# Patient Record
Sex: Female | Born: 1970 | Race: White | Hispanic: No | Marital: Single
Health system: Midwestern US, Community
[De-identification: ages and names within clinical notes are randomized; demographics above are authoritative.]

---

## 2014-09-03 ENCOUNTER — Encounter (HOSPITAL_COMMUNITY): Payer: Self-pay | Admitting: Emergency Medicine

## 2014-09-03 ENCOUNTER — Emergency Department (HOSPITAL_COMMUNITY): Payer: Medicaid - Out of State

## 2014-09-03 ENCOUNTER — Emergency Department (HOSPITAL_COMMUNITY)
Admission: EM | Admit: 2014-09-03 | Discharge: 2014-09-03 | Disposition: A | Discharge status: Home / Self Care | Payer: Medicaid - Out of State | Attending: Emergency Medicine | Admitting: Emergency Medicine

## 2014-09-03 ENCOUNTER — Emergency Department (HOSPITAL_COMMUNITY)
Admission: EM | Admit: 2014-09-03 | Discharge: 2014-09-03 | Disposition: A | Discharge status: Home / Self Care | Payer: PRIVATE HEALTH INSURANCE | Attending: Emergency Medicine | Admitting: Emergency Medicine

## 2014-09-03 DIAGNOSIS — F209 Schizophrenia, unspecified: Secondary | ICD-10-CM | POA: Diagnosis not present

## 2014-09-03 DIAGNOSIS — R112 Nausea with vomiting, unspecified: Secondary | ICD-10-CM | POA: Diagnosis not present

## 2014-09-03 DIAGNOSIS — Z720 Tobacco use: Secondary | ICD-10-CM | POA: Insufficient documentation

## 2014-09-03 DIAGNOSIS — Z046 Encounter for general psychiatric examination, requested by authority: Secondary | ICD-10-CM | POA: Insufficient documentation

## 2014-09-03 DIAGNOSIS — Z3202 Encounter for pregnancy test, result negative: Secondary | ICD-10-CM | POA: Diagnosis not present

## 2014-09-03 DIAGNOSIS — N730 Acute parametritis and pelvic cellulitis: Secondary | ICD-10-CM | POA: Insufficient documentation

## 2014-09-03 DIAGNOSIS — R1030 Lower abdominal pain, unspecified: Secondary | ICD-10-CM | POA: Diagnosis present

## 2014-09-03 DIAGNOSIS — Z008 Encounter for other general examination: Secondary | ICD-10-CM

## 2014-09-03 DIAGNOSIS — R102 Pelvic and perineal pain: Secondary | ICD-10-CM

## 2014-09-03 LAB — COMPREHENSIVE METABOLIC PANEL
ALBUMIN: 3.8 g/dL (ref 3.5–5.2)
ALT: 12 U/L (ref 0–35)
AST: 12 U/L (ref 0–37)
Alkaline Phosphatase: 88 U/L (ref 39–117)
Anion gap: 19 — ABNORMAL HIGH (ref 5–15)
BUN: 8 mg/dL (ref 6–23)
CALCIUM: 8.6 mg/dL (ref 8.4–10.5)
CO2: 24 mEq/L (ref 19–32)
CREATININE: 0.83 mg/dL (ref 0.50–1.10)
Chloride: 101 mEq/L (ref 96–112)
GFR calc Af Amer: >90 mL/min (ref >90)
GFR calc non Af Amer: 86 mL/min — ABNORMAL LOW (ref >90)
Glucose, Bld: 86 mg/dL (ref 70–99)
Potassium: 4.5 mEq/L (ref 3.7–5.3)
Sodium: 144 mEq/L (ref 137–147)
Total Bilirubin: 0.3 mg/dL (ref 0.3–1.2)
Total Protein: 8.7 g/dL — ABNORMAL HIGH (ref 6.0–8.3)

## 2014-09-03 LAB — CBC WITH DIFFERENTIAL/PLATELET
BASOS ABS: 0 10*3/uL (ref 0.0–0.1)
Basophils Relative: 0 % (ref 0–1)
Eosinophils Absolute: 0.2 10*3/uL (ref 0.0–0.7)
Eosinophils Relative: 1 % (ref 0–5)
HCT: 45.1 % (ref 36.0–46.0)
Hemoglobin: 14.8 g/dL (ref 12.0–15.0)
LYMPHS ABS: 3.2 10*3/uL (ref 0.7–4.0)
Lymphocytes Relative: 21 % (ref 12–46)
MCH: 28.8 pg (ref 26.0–34.0)
MCHC: 32.8 g/dL (ref 30.0–36.0)
MCV: 87.7 fL (ref 78.0–100.0)
MONO ABS: 0.9 10*3/uL (ref 0.1–1.0)
MONOS PCT: 6 % (ref 3–12)
Neutro Abs: 10.9 10*3/uL — ABNORMAL HIGH (ref 1.7–7.7)
Neutrophils Relative %: 72 % (ref 43–77)
Platelets: 210 10*3/uL (ref 150–400)
RBC: 5.14 MIL/uL — ABNORMAL HIGH (ref 3.87–5.11)
RDW: 15.3 % (ref 11.5–15.5)
WBC: 15.2 10*3/uL — ABNORMAL HIGH (ref 4.0–10.5)

## 2014-09-03 LAB — URINALYSIS, ROUTINE W REFLEX MICROSCOPIC
BILIRUBIN URINE: NEGATIVE
GLUCOSE, UA: NEGATIVE mg/dL
HGB URINE DIPSTICK: NEGATIVE
Ketones, ur: NEGATIVE mg/dL
NITRITE: NEGATIVE
Protein, ur: NEGATIVE mg/dL
SPECIFIC GRAVITY, URINE: 1.027 (ref 1.005–1.030)
Urobilinogen, UA: 0.2 mg/dL (ref 0.0–1.0)
pH: 5.5 (ref 5.0–8.0)

## 2014-09-03 LAB — I-STAT BETA HCG BLOOD, ED (MC, WL, AP ONLY): I-stat hCG, quantitative: <5 m[IU]/mL (ref <5)

## 2014-09-03 LAB — RPR

## 2014-09-03 LAB — URINE MICROSCOPIC-ADD ON

## 2014-09-03 LAB — WET PREP, GENITAL
Clue Cells Wet Prep HPF POC: NONE SEEN
Trich, Wet Prep: NONE SEEN
Yeast Wet Prep HPF POC: NONE SEEN

## 2014-09-03 LAB — LIPASE, BLOOD: LIPASE: 20 U/L (ref 11–59)

## 2014-09-03 LAB — HIV ANTIBODY (ROUTINE TESTING W REFLEX): HIV 1&2 Ab, 4th Generation: NONREACTIVE

## 2014-09-03 MED ORDER — ONDANSETRON 8 MG PO TBDP
8.0000 mg | ORAL_TABLET | Freq: Once | ORAL | Status: AC
  Administered 2014-09-03: 8 mg via ORAL
  Filled 2014-09-03: qty 1

## 2014-09-03 MED ORDER — DOXYCYCLINE HYCLATE 100 MG PO CAPS: 100.0000 mg | ORAL_CAPSULE | Freq: Two times a day (BID) | ORAL | Status: AC

## 2014-09-03 MED ORDER — IOHEXOL 300 MG/ML  SOLN
100.0000 mL | Freq: Once | INTRAMUSCULAR | Status: AC | PRN
  Administered 2014-09-03: 100 mL via INTRAVENOUS

## 2014-09-03 MED ORDER — CEFTRIAXONE SODIUM 250 MG IJ SOLR
250.0000 mg | Freq: Once | INTRAMUSCULAR | Status: AC
  Administered 2014-09-03: 250 mg via INTRAMUSCULAR
  Filled 2014-09-03: qty 250

## 2014-09-03 MED ORDER — LIDOCAINE HCL 1 % IJ SOLN
INTRAMUSCULAR | Status: AC
  Administered 2014-09-03: 20 mL
  Filled 2014-09-03: qty 20

## 2014-09-03 MED ORDER — METRONIDAZOLE 500 MG PO TABS: 500.0000 mg | ORAL_TABLET | Freq: Two times a day (BID) | ORAL | Status: AC

## 2014-09-03 NOTE — ED Notes (Signed)
Pt c/o acute onset lower abd pain, worse on Left since 2000 last night. N/v per pt for last few days.
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
## 2014-09-03 NOTE — ED Notes (Signed)
Pt given a food tray.

## 2014-09-03 NOTE — ED Notes (Signed)
Per SW, Brandi Lawrence, [facility] [employer] around 6pm. Was also given a cab voucher in case PD gets busy and unable to transport. Pt aware of plan.

## 2014-09-03 NOTE — Discharge Instructions (Signed)
°Emergency Department Resource Guide °1) Find a Doctor and Pay Out of Pocket °Although you won't have to find out who is covered by your insurance plan, it is a good idea to ask around and get recommendations. You will then need to call the office and see if the doctor you have chosen will accept you as a new patient and what types of options they offer for patients who are self-pay. Some doctors offer discounts or will set up payment plans for their patients who do not have insurance, but you will need to ask so you aren't surprised when you get to your appointment. ° °2) Contact Your Local Health Department °Not all health departments have doctors that can see patients for sick visits, but many do, so it is worth a call to see if yours does. If you don't know where your local health department is, you can check in your phone book. The CDC also has a tool to help you locate your state's health department, and many state websites also have listings of all of their local health departments. ° °3) Find a Walk-in Clinic °If your illness is not likely to be very severe or complicated, you may want to try a walk in clinic. These are popping up all over the country in pharmacies, drugstores, and shopping centers. They're usually staffed by nurse practitioners or physician assistants that have been trained to treat common illnesses and complaints. They're usually fairly quick and inexpensive. However, if you have serious medical issues or chronic medical problems, these are probably not your best option. ° °No Primary Care Doctor: °- Call [facility] at  832-8000 - they can help you locate a primary care doctor that  accepts your insurance, provides certain services, etc. °- Physician Referral Service- 1-800-533-3463 ° °Chronic Pain Problems: °Organization         Address  Phone   Notes  °[facility]  (336) 297-2271 Patients need to be referred by their primary care doctor.  ° °Medication  Assistance: °Organization         Address  Phone   Notes  °[place] [facility] [address] °[place], [place] [address] (336) 641-8030 --Must be a resident of [place] °-- Must have NO insurance coverage whatsoever (no Medicaid/ Medicare, etc.) °-- The pt. MUST have a primary care doctor that directs their care regularly and follows them in the community °  °[facility]  (866) 331-1348   °[employer]  (888) 892-1162   ° °Agencies that provide inexpensive medical care: °Organization         Address  Phone   Notes  °[facility]  (336) 832-8035   °[facility]    (336) 832-7272   °[facility] [address] °[place], [place] [address] (336) 832-4777   °[facility] [address], °[place] (336) 271-4999   °[facility]    (336) 373-0678   °[facility]    (336) 272-1050   °[facility] ° [address], [place] Phone:  (336) 832-4444, Fax:  (336) 832-4440 Hours of Operation:  9 am - 6 pm, M-F.  Also accepts Medicaid/Medicare and self-pay.  °[facility] ° [address],  Phone: (336) 832-3150, Fax: (336) 832-3151. Hours of Operation:  8:30 am - 5:30 pm, M-F.  Also accepts Medicaid and self-pay.  °[facility] [place] [address], [place] Phone: (336) 878-6027   °[facility] [address], [place], [place] (336)723-1848, Ext. 123 Mondays & Thursdays: 7-9 AM.  First 15 patients are seen on a first come, first serve basis. °  ° °Medicaid-accepting [place] Providers: ° °Organization         Address  Phone   Notes  °[facility] [address], [place] (336) 641-2100 Also accepts self-pay patients.  °[facility] [address], [place] ° (336) 856-9996   °[facility] [address], [place]  (336) 288-8857   °[facility] [address] [place] [address], [place] (336) 299-7000   °Veita Bland [address], [place]  ° (336) 373-1557 Only accepts [place] Access Medicaid patients after they have their name applied to their card.  ° °Self-Pay (no insurance) in [place]: ° °Organization         Address  Phone   Notes  °Sickle Cell Patients, [facility] [address], [place] (336) 832-1970   °[facility] [address], [place] (336) 832-4400   °[facility] ° [address], [place] (336) 992-4800   °[facility]/Dr. Osei-Bonsu ° [address] [place] [address], [place] or [address], [place] (336) 841-8500 Phone number for both [place] and [place] locations is the same.  °[facility] [address], [place] (336) 299-0000   °[facility] [address] [place] [address], [place] or [address] (336) 852-7530 °(336) 878-2260   °[facility] [address], [place] (336) 350-1642, phone; (336) 294-5005, fax Sees patients 1st and 3rd Saturday of every month.  Must not qualify for public or private insurance (i.e. Medicaid, Medicare, [place] Health Choice, Veterans' Benefits) • Household income should be no more than 200% of the poverty level •The clinic cannot treat you if you are pregnant or think you are pregnant • Sexually transmitted diseases are not treated at the clinic.  ° ° °Dental Care: °Organization         Address  Phone  Notes  °[place] [facility] [address], [place] (336) 641-6152 Accepts children up to age 21 who are enrolled in Medicaid or [place] Health Choice; pregnant women with a Medicaid card; and children who have applied for Medicaid or [place] Health Choice, but were declined, whose parents can pay a reduced fee at time of service.  °[place]  [facility] [place]  [address], [place] (336) 641-7733 Accepts children up to age 21 who are enrolled in Medicaid or [place] Health Choice; pregnant women with a Medicaid card; and children who have applied for Medicaid or [place] Health Choice, but were declined, whose parents can pay a reduced fee at time of service.  °[facility] ° [address], [place] (336) 641-4533 Patients are seen by appointment only. Walk-ins are not accepted. [facility] will see patients 18 years of age and older. °Monday - Tuesday (8am-5pm) °Most Wednesdays (8:30-5pm) °$30 per visit, cash only  °[facility] ° [address], [place] (336) 641-4533 Patients are seen by appointment only. Walk-ins are not accepted. [facility] will see patients 18 years of age and older. °One   Wednesday Evening (Monthly: Volunteer Based).  $30 per visit, cash only  °[facility]  (919) 537-3737 for adults; Children under age 4, call Graduate Pediatric Dentistry at (919) [phone]. Children aged 4-14, please call (919) [phone] to request a pediatric application. ° Dental services are provided in all areas of dental care including fillings, crowns and bridges, complete and partial dentures, implants, gum treatment, root canals, and extractions. Preventive care is also provided. Treatment is provided to both adults and children. °Patients are selected via a lottery and there is often a waiting list. °  °[facility] [address], °[place] ° (336) 763-8833 [email or web address] °  °[facility] [address], [place], [place] (336)723-1848, Ext. 123 Second and Fourth Thursday of each month, opens at 6:30 AM; Clinic ends at 9 AM.  Patients are seen on a first-come first-served basis, and a limited number are seen during each clinic.  ° °[facility] ° [address], [place], [place] (336) 723-7904    Eligibility Requirements °You must have lived in [place], or [place] counties for at least the last three months. °  You cannot be eligible for state or federal sponsored healthcare insurance, including Veterans Administration, Medicaid, or Medicare. °  You generally cannot be eligible for healthcare insurance through your employer.  °  How to apply: °Eligibility screenings are held every Tuesday and Wednesday afternoon from 1:00 pm until 4:00 pm. You do not need an appointment for the interview!  °[facility] [address], [place], [place] 336-631-2330   °[facility] [facility]  336-342-8273   °[facility]  336-703-3100   ° [facility]  336-570-6415   ° °Behavioral Health Resources in the Community: °Intensive Outpatient Programs °Organization         Address  Phone  Notes  °[facility] [address], [place], [place] 336-878-6098   °[facility] [address], [place], [place] 336-832-9800   °[facility] [address], [place], [place] ° 336-882-2125   °[facility] [address],  °[place], [place] 1-800-853-5163 or 336-641-4981   °Substance Abuse Resources °Organization         Address  Phone  Notes  °[facility]  336-882-2125   °[facility]  336-784-9470   °[facility]  336-285-9073   °[facility]  336-845-3988   °Residential & Outpatient Substance Abuse Program  1-800-659-3381   °Psychological Services °Organization         Address  Phone  Notes  °[facility]  336- 832-9600   °[facility]  336- 378-7881   °[facility] [address], [place] 1-800-853-5163 or 336-641-4981   ° °Mobile Crisis Teams °Organization         Address  Phone  Notes  °[facility], Mobile Crisis Care Unit  1-877-626-1772   °Assertive °[facility] ° [address]  [place], [place] 336-834-9664   °Sharon DeEsch [address] °[place] 336-554-5454   ° °Self-Help/Support Groups °Organization         Address  Phone             Notes  °[facility] [place] - variety of support groups  336- 373-1402 Call for more information  °Narcotics Anonymous (NA), [facility] [address], °[place]  2 meetings at this location  ° °  Residential Treatment Programs °Organization         Address  Phone  Notes  °[facility] [address],    °[place]  1-866-801-8205   °[facility] ° [address], [place], [place] 704-293-8524   °[facility] [address], [place] 336-845-3988 Admissions: 8am-3pm M-F  °[facility] [address]    °[place], [place] 336-841-1104   °[facility] [address], [place], [place] 336-379-7146   °[facility] [address]  °[place], [place] 336-285-9073   °[facility] Intensive Outpatient [address], [place], [place] 336-852-3033   °[facility] [address]  °[place], [place] 1-877-615-2722 or 336-784-9470   °[facility] [address] [place], [place] 336-227-7417 Accepts Medicaid  °[facility] [address]  ° [place] 1-800-659-3381 Substance Abuse/Addiction Treatment  ° °[place] Behavioral Health Resources °Organization         Address  Phone  Notes  °[facility]  (888) 581-9988   °Julie Brannon, PhD [address] [place], [place]   (336) 349-5553 or (336) 951-0000   °[facility]   [address] °[place], [place] (336) 349-4454   °[facility] [address], [place], [place] (336) 342-8316 Insurance/Medicaid/sponsorship through [facility]  °[facility] [address]                                    [place], [place] (336) 342-8316 Therapy/tele-psych/case    °[facility] [address]  ° [place], [place] (336) 349-2233    °Dr. Arfeen  (336) 349-4544   °[facility] [place]  [employer] [place] [facility] 1) [address], [place] °2) [address], [place] °3)  [address], [place] (336) 349-3220 °(336) 342-7768 ° °(336) 342-8140   °[place] [facility] (336) 342-1394 or (336) 342-3537 (After Hours)    ° ° °

## 2014-09-03 NOTE — ED Provider Notes (Signed)
CSN: [id number]     Arrival date & time 09/03/14  1134 History   First MD Initiated Contact with Patient 09/03/14 1140     Chief Complaint  Patient presents with  . Abdominal Pain  . Nausea     (Consider location/radiation/quality/duration/timing/severity/associated sxs/prior Treatment) HPI Comments: Patient here complaining of worsening lower abdominal pain 1 month. Patient is also a schizophrenic and she has been noncompliant with medications for month. She denies any suicidal or homicidal ideations. She denies any command hallucinations. Abdominal pain is persistent and characterized as dull and located at her left upper and left lower quadrant. Denies any vaginal bleeding or discharge. Denies any urinary symptoms. Symptoms persistent and no association with food. No treatment use prior to arrival. She has had some nausea and vomiting which has been nonbilious. Denies any black or bloody stools. No prior history of same  Patient is a 43 y.o. female presenting with abdominal pain. The history is provided by the patient.  Abdominal Pain   History reviewed. No pertinent past medical history. No past surgical history on file. No family history on file. History  Substance Use Topics  . Smoking status: Not on file  . Smokeless tobacco: Not on file  . Alcohol Use: Not on file   OB History   Grav Para Term Preterm Abortions TAB SAB Ect Mult Living                 Review of Systems  Gastrointestinal: Positive for abdominal pain.  All other systems reviewed and are negative.     Allergies  Review of patient's allergies indicates not on file.  Home Medications   Prior to Admission medications   Not on File   BP 131/90  Pulse 101  Temp(Src) 98.5 F (36.9 C) (Oral)  Resp 20  SpO2 95% Physical Exam  Nursing note and vitals reviewed. Constitutional: She is oriented to person, place, and time. She appears well-developed and well-nourished.  Non-toxic appearance. No  distress.  HENT:  Head: Normocephalic and atraumatic.  Eyes: Conjunctivae, EOM and lids are normal. Pupils are equal, round, and reactive to light.  Neck: Normal range of motion. Neck supple. No tracheal deviation present. No mass present.  Cardiovascular: Normal rate, regular rhythm and normal heart sounds.  Exam reveals no gallop.   No murmur heard. Pulmonary/Chest: Effort normal and breath sounds normal. No stridor. No respiratory distress. She has no decreased breath sounds. She has no wheezes. She has no rhonchi. She has no rales.  Abdominal: Soft. Normal appearance and bowel sounds are normal. She exhibits no distension. There is tenderness in the epigastric area and left upper quadrant. There is no rigidity, no rebound, no guarding and no CVA tenderness.  Musculoskeletal: Normal range of motion. She exhibits no edema and no tenderness.  Neurological: She is alert and oriented to person, place, and time. She has normal strength. No cranial nerve deficit or sensory deficit. GCS eye subscore is 4. GCS verbal subscore is 5. GCS motor subscore is 6.  Skin: Skin is warm and dry. No abrasion and no rash noted.  Psychiatric: Her speech is normal and behavior is normal. Her affect is blunt.    ED Course  Procedures (including critical care time) Labs Review Labs Reviewed  URINE CULTURE  CBC WITH DIFFERENTIAL  COMPREHENSIVE METABOLIC PANEL  URINALYSIS, ROUTINE W REFLEX MICROSCOPIC  LIPASE, BLOOD    Imaging Review No results found.   EKG Interpretation None      MDM  Final diagnoses:  None    Patient to be treated for PID with Rocephin and Flagyl and doxycycline.    Toy BakerAnthony T Dennette Faulconer, MD 09/03/14 [phone]

## 2014-09-03 NOTE — ED Notes (Signed)
Pt provided with update about trying to get case worker or SW to help her. Pt also provided with sandwich and coke. Pt denies pain, is A&O and in NAD
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
## 2014-09-03 NOTE — ED Notes (Signed)
Bed: WA07 Expected date: 09/03/14 Expected time: 11:27 AM Means of arrival: Ambulance Comments: abd pain
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
## 2014-09-03 NOTE — Progress Notes (Addendum)
4:37pm. Thayer JewJanice Mays at [facility] has offered pt a [facility]. Pt can go after 6pm.  [facility]: [address], [place] Ph #: [phone]   Mariann LasterAlexandra Matson Welch LCSWA,     ED CSW  phone: [phone]
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
## 2014-09-03 NOTE — ED Notes (Signed)
Brandi Lawrence SW was told about by this RN and [employer] officer. SW will come talk to her soon

## 2014-09-03 NOTE — ED Notes (Signed)
[employer] off duty Technical sales engineerofficer and RN Hailey at bedside talking to pt

## 2014-09-03 NOTE — Discharge Instructions (Signed)
Pelvic Inflammatory Disease °Pelvic inflammatory disease (PID) refers to an infection in some or all of the female organs. The infection can be in the uterus, ovaries, fallopian tubes, or the surrounding tissues in the pelvis. PID can cause abdominal or pelvic pain that comes on suddenly (acute pelvic pain). PID is a serious infection because it can lead to lasting (chronic) pelvic pain or the inability to have children (infertile).  °CAUSES  °The infection is often caused by the normal bacteria found in the vaginal tissues. PID may also be caused by an infection that is spread during sexual contact. PID can also occur following:  °· The birth of a baby.   °· A miscarriage.   °· An abortion.   °· Major pelvic surgery.   °· The use of an intrauterine device (IUD).   °· A sexual assault.   °RISK FACTORS °Certain factors can put a person at higher risk for PID, such as: °· Being younger than 25 years. °· Being sexually active at a young age. °· Using nonbarrier contraception. °· Having multiple sexual partners. °· Having sex with someone who has symptoms of a genital infection. °· Using oral contraception. °Other times, certain behaviors can increase the possibility of getting PID, such as: °· Having sex during your period. °· Using a vaginal douche. °· Having an intrauterine device (IUD) in place. °SYMPTOMS  °· Abdominal or pelvic pain.   °· Fever.   °· Chills.   °· Abnormal vaginal discharge. °· Abnormal uterine bleeding.   °· Unusual pain shortly after finishing your period. °DIAGNOSIS  °Your caregiver will choose some of the following methods to make a diagnosis, such as:  °· Performing a physical exam and history. A pelvic exam typically reveals a very tender uterus and surrounding pelvis.   °· Ordering laboratory tests including a pregnancy test, blood tests, and urine test.  °· Ordering cultures of the vagina and cervix to check for a sexually transmitted infection (STI). °· Performing an ultrasound.    °· Performing a laparoscopic procedure to look inside the pelvis.   °TREATMENT  °· Antibiotic medicines may be prescribed and taken by mouth.   °· Sexual partners may be treated when the infection is caused by a sexually transmitted disease (STD).   °· Hospitalization may be needed to give antibiotics intravenously. °· Surgery may be needed, but this is rare. °It may take weeks until you are completely well. If you are diagnosed with PID, you should also be checked for human immunodeficiency virus (HIV).   °HOME CARE INSTRUCTIONS  °· If given, take your antibiotics as directed. Finish the medicine even if you start to feel better.   °· Only take over-the-counter or prescription medicines for pain, discomfort, or fever as directed by your caregiver.   °· Do not have sexual intercourse until treatment is completed or as directed by your caregiver. If PID is confirmed, your recent sexual partner(s) will need treatment.   °· Keep your follow-up appointments. °SEEK MEDICAL CARE IF:  °· You have increased or abnormal vaginal discharge.   °· You need prescription medicine for your pain.   °· You vomit.   °· You cannot take your medicines.   °· Your partner has an STD.   °SEEK IMMEDIATE MEDICAL CARE IF:  °· You have a fever.   °· You have increased abdominal or pelvic pain.   °· You have chills.   °· You have pain when you urinate.   °· You are not better after 72 hours following treatment.   °MAKE SURE YOU:  °· Understand these instructions. °· Will watch your condition. °· Will get help right away if you are not doing well or get worse. °  Document Released: 10/21/2005 Document Revised: 02/15/2013 Document Reviewed: 10/17/2011 °ExitCare® Patient Information ©2015 [employer]. This information is not intended to replace advice given to you by your health care provider. Make sure you discuss any questions you have with your health care provider. ° °
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
## 2014-09-05 LAB — URINE CULTURE

## 2014-09-06 LAB — GC/CHLAMYDIA PROBE AMP
CT Probe RNA: NEGATIVE
GC Probe RNA: NEGATIVE

## 2014-11-10 DIAGNOSIS — J069 Acute upper respiratory infection, unspecified: Secondary | ICD-10-CM

## 2014-11-10 NOTE — ED Provider Notes (Signed)
HPI Comments: Patient currently homeless was [facility] last night and today at the shelter, started feeling "achy" with upper and lower abdominal pain.  Patient relates some nausea, when asked how many times she vomited she said more than I can count, when pressed for further details she estimates that to be 3 or 4 times today.    Patient is a 44 y.o. female presenting with other event. The history is provided by the patient.   Other  This is a new problem. The current episode started 2 days ago. The problem occurs constantly. The problem has not changed since onset.Associated symptoms include abdominal pain and headaches. Pertinent negatives include no chest pain and no shortness of breath. Associated symptoms comments: Nausea, abdominal pain.. Nothing relieves the symptoms. She has tried nothing for the symptoms. The treatment provided no relief.        History reviewed. No pertinent past medical history.    History reviewed. No pertinent past surgical history.      History reviewed. No pertinent family history.    History     Social History   ??? Marital Status: SINGLE     Spouse Name: N/A     Number of Children: N/A   ??? Years of Education: N/A     Occupational History   ??? Not on file.     Social History Main Topics   ??? Smoking status: Current Every Day Smoker -- 1.00 packs/day   ??? Smokeless tobacco: Not on file   ??? Alcohol Use: No   ??? Drug Use: No   ??? Sexual Activity: Not on file     Other Topics Concern   ??? Not on file     Social History Narrative   ??? No narrative on file                ALLERGIES: Review of patient's allergies indicates no known allergies.      Review of Systems   Constitutional: Negative for fever and chills.   HENT: Positive for congestion, rhinorrhea and sinus pressure. Negative for sore throat.    Eyes: Negative for discharge and redness.   Respiratory: Negative for cough and shortness of breath.    Cardiovascular: Negative for chest pain and palpitations.    Gastrointestinal: Positive for nausea, vomiting and abdominal pain. Negative for diarrhea.   Musculoskeletal: Negative for back pain and arthralgias.   Skin: Negative for rash.   Neurological: Positive for headaches. Negative for dizziness.   All other systems reviewed and are negative.      Filed Vitals:    11/10/14 2009   BP: 138/91   Pulse: 91   Temp: 97.5 ??F (36.4 ??C)   Resp: 20   Height: 5\' 3"  (1.6 m)   Weight: 99.791 kg (220 lb)   SpO2: 94%            Physical Exam   Constitutional: She is oriented to person, place, and time. She appears well-developed and well-nourished. No distress.   HENT:   Head: Normocephalic and atraumatic.   Mouth/Throat: No oropharyngeal exudate.   Eyes: Conjunctivae and EOM are normal. Pupils are equal, round, and reactive to light. Right eye exhibits no discharge. Left eye exhibits no discharge. No scleral icterus.   Neck: Normal range of motion. Neck supple.   Cardiovascular: Normal rate, regular rhythm and normal heart sounds.  Exam reveals no gallop.    No murmur heard.  Pulmonary/Chest: Effort normal and breath sounds normal. No respiratory distress.  She has no wheezes. She has no rales.   Abdominal: Soft. Bowel sounds are normal. There is no tenderness. There is no guarding.   Musculoskeletal: Normal range of motion. She exhibits no edema.   Neurological: She is alert and oriented to person, place, and time. She exhibits normal muscle tone.   cni 2-12 grossly   Skin: Skin is warm and dry. She is not diaphoretic.   Psychiatric: She has a normal mood and affect. Her behavior is normal.   Nursing note and vitals reviewed.       MDM    Procedures

## 2014-11-10 NOTE — ED Notes (Signed)
Unable to locate in room.  Belongings remain in room.

## 2014-11-10 NOTE — ED Notes (Signed)
States now homeless.  Trying to get to a friends in [place].  States she hurts because she has been walking around.  States needs a place to stay and very hungry.

## 2014-11-11 ENCOUNTER — Inpatient Hospital Stay
Admit: 2014-11-11 | Discharge: 2014-11-11 | Disposition: A | Discharge status: Home / Self Care | Payer: PRIVATE HEALTH INSURANCE | Attending: Emergency Medicine

## 2014-11-11 LAB — HCG URINE, QL. - POC: Pregnancy test,urine (POC): NEGATIVE

## 2014-11-11 MED ORDER — CEPHALEXIN 500 MG CAP
500 mg | ORAL_CAPSULE | Freq: Four times a day (QID) | ORAL | Status: AC
Start: 2014-11-11 — End: 2014-11-18

## 2014-11-11 MED ORDER — ACETAMINOPHEN 500 MG TAB
500 mg | ORAL | Status: AC
Start: 2014-11-11 — End: 2014-11-11
  Administered 2014-11-11: 05:00:00 via ORAL

## 2014-11-11 MED ORDER — CEPHALEXIN 500 MG CAP
500 mg | ORAL | Status: AC
Start: 2014-11-11 — End: 2014-11-11
  Administered 2014-11-11: 11:00:00 via ORAL

## 2014-11-11 MED ORDER — ONDANSETRON 8 MG TAB, RAPID DISSOLVE
8 mg | ORAL | Status: AC
Start: 2014-11-11 — End: 2014-11-11
  Administered 2014-11-11: 05:00:00 via ORAL

## 2014-11-11 MED ORDER — IBUPROFEN 800 MG TAB
800 mg | ORAL | Status: AC
Start: 2014-11-11 — End: 2014-11-11
  Administered 2014-11-11: 05:00:00 via ORAL

## 2014-11-11 MED FILL — CEPHALEXIN 500 MG CAP: 500 mg | ORAL | Qty: 1

## 2014-11-11 MED FILL — MAPAP EXTRA STRENGTH 500 MG TABLET: 500 mg | ORAL | Qty: 2

## 2014-11-11 MED FILL — IBUPROFEN 800 MG TAB: 800 mg | ORAL | Qty: 1

## 2014-11-11 MED FILL — ONDANSETRON 8 MG TAB, RAPID DISSOLVE: 8 mg | ORAL | Qty: 1
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
## 2014-11-11 NOTE — ED Notes (Signed)
Sleeping.  Respirations even and unlabored.

## 2014-11-11 NOTE — ED Notes (Signed)
Patient discharged to lobby.  States she was told by MD she could wait and talk to Child psychotherapistsocial worker.  Discharge instructions and prescription reviewed with understanding reviewed.

## 2014-11-12 DIAGNOSIS — M79606 Pain in leg, unspecified: Secondary | ICD-10-CM

## 2014-11-12 NOTE — ED Notes (Signed)
I have reviewed discharge instructions with the patient.  The patient verbalized understanding.  Dr. Ernestine ConradBerglind instructed that pt can stay out in lobby until 0600. [facility]

## 2014-11-12 NOTE — ED Notes (Signed)
Pt arrive via EMS from a shelter. Was seen 2 days ago here, diagnosed with UTI and has prescriptions but did not get them filled. Pt also c/o leg pain due to walking and having no home. EMS states they picked her up because there was no room at shelter and then that's when pt c/o leg pain from walking.

## 2014-11-12 NOTE — ED Provider Notes (Signed)
HPI Comments: 44 year old white female has been homeless who presented with a UTI recently. Was given prescriptions for antibiotics [facility] did not fill them.  She stated her lobby all last night.  She is unable to go back to the [facility] as she is else stayed her 3 days.  [facility] but they said because 6 AM in the morning they should have one opening at that time and would they would take her.    Patient is a 44 y.o. female presenting with urinary tract infection and leg pain.   Bladder Infection   Pertinent negatives include no frequency, no urgency and no flank pain.   Leg Pain   Pertinent negatives include no back pain and no neck pain.        History reviewed. No pertinent past medical history.    History reviewed. No pertinent past surgical history.      History reviewed. No pertinent family history.    History     Social History   ??? Marital Status: SINGLE     Spouse Name: N/A     Number of Children: N/A   ??? Years of Education: N/A     Occupational History   ??? Not on file.     Social History Main Topics   ??? Smoking status: Current Every Day Smoker -- 1.00 packs/day   ??? Smokeless tobacco: Not on file   ??? Alcohol Use: No   ??? Drug Use: No   ??? Sexual Activity: Not on file     Other Topics Concern   ??? Not on file     Social History Narrative                ALLERGIES: Review of patient's allergies indicates no known allergies.      Review of Systems   Constitutional: Negative for fever and fatigue.   HENT: Negative for congestion and sinus pressure.    Respiratory: Negative for cough, chest tightness and shortness of breath.    Cardiovascular: Negative for chest pain and leg swelling.   Gastrointestinal: Negative for abdominal pain.   Genitourinary: Negative for dysuria, urgency, frequency and flank pain.   Musculoskeletal: Negative for back pain, joint swelling, neck pain and neck stiffness.   Skin: Negative for rash.   Neurological: Negative for headaches.    Psychiatric/Behavioral: Negative for confusion and agitation.   All other systems reviewed and are negative.      Filed Vitals:    11/12/14 2006   BP: 133/78   Pulse: 102   Temp: 97.8 ??F (36.6 ??C)   Resp: 16   Height: 5\' 4"  (1.626 m)   Weight: 99.7 kg (219 lb 12.8 oz)   SpO2: 97%            Physical Exam   Constitutional: She is oriented to person, place, and time. She appears well-developed and well-nourished.   HENT:   Head: Normocephalic and atraumatic.   Right Ear: External ear normal.   Left Ear: External ear normal.   Nose: Nose normal.   Mouth/Throat: Oropharynx is clear and moist.   Eyes: Conjunctivae and EOM are normal. Pupils are equal, round, and reactive to light. Right eye exhibits no discharge. Left eye exhibits no discharge. No scleral icterus.   Neck: Normal range of motion. Neck supple. No thyromegaly present.   Cardiovascular: Normal rate, regular rhythm and intact distal pulses.  Exam reveals friction rub. Exam reveals no  gallop.    No murmur heard.  Pulmonary/Chest: Effort normal and breath sounds normal. No respiratory distress. She has no wheezes.   Abdominal: Soft.   Musculoskeletal: Normal range of motion. She exhibits no edema or tenderness.   Neurological: She is alert and oriented to person, place, and time.   Skin: Skin is warm and dry.   Nursing note and vitals reviewed.       MDM    Procedures

## 2014-11-12 NOTE — ED Notes (Signed)
[facility]we called the mission tonight and she cannot go back to the [facility].  The mission said stated that they will take her but they do not have any beds for tonight she is to call around 6 AM in the morning.  The ward secretary is where of this and she will call for a bed for this patient at 6 AM tomorrow morning.

## 2014-11-13 ENCOUNTER — Inpatient Hospital Stay
Admit: 2014-11-13 | Discharge: 2014-11-13 | Disposition: A | Discharge status: Home / Self Care | Payer: PRIVATE HEALTH INSURANCE | Attending: Family Medicine

## 2014-11-13 MED ORDER — LEVOFLOXACIN 500 MG TAB
500 mg | ORAL | Status: AC
Start: 2014-11-13 — End: 2014-11-12
  Administered 2014-11-13: 02:00:00 via ORAL

## 2014-11-13 MED ORDER — ONDANSETRON 8 MG TAB, RAPID DISSOLVE
8 mg | ORAL | Status: AC
Start: 2014-11-13 — End: 2014-11-12
  Administered 2014-11-13: 02:00:00 via ORAL

## 2014-11-13 MED FILL — LEVOFLOXACIN 500 MG TAB: 500 mg | ORAL | Qty: 1

## 2014-11-13 MED FILL — ONDANSETRON 8 MG TAB, RAPID DISSOLVE: 8 mg | ORAL | Qty: 1

## 2016-04-23 IMAGING — CT CT ABD-PELV W/ CM
1 of 3 series · 14 of 32 positions shown, 19 images · IV contrast (OMNIPAQUE 300)
Comparison: None.

CLINICAL DATA: Left for abdominal pain. Onset last night. Nausea
and vomiting for 3 days.

EXAM:
CT ABDOMEN AND PELVIS WITH CONTRAST
TECHNIQUE: Multidetector CT imaging of the abdomen and pelvis was performed
using the standard protocol following bolus administration of
intravenous contrast.
CONTRAST:  100mL OMNIPAQUE IOHEXOL 300 MG/ML  SOLN

[Series 2: abd/pel with · axial · 0.85mm/px · z∈[-475,-100]mm · 14 of 85 slices shown, 19 images]
[im 5/85  soft-tissue]
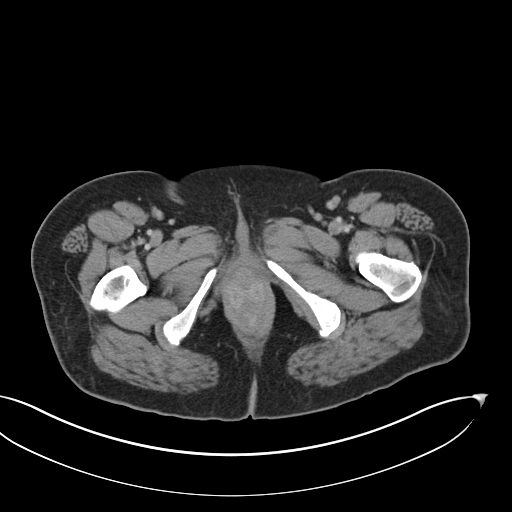
[im 5/85  bone]
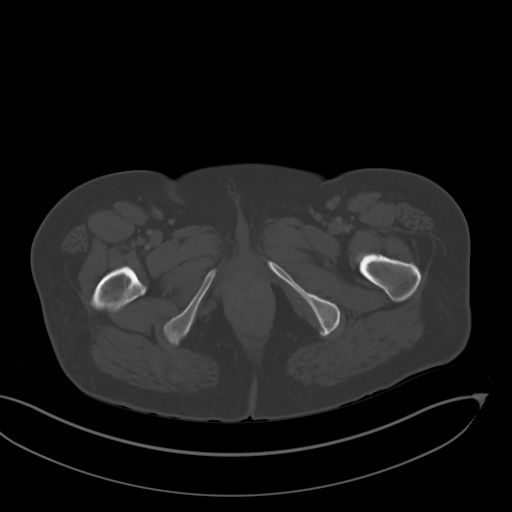
[im 10/85  soft-tissue]
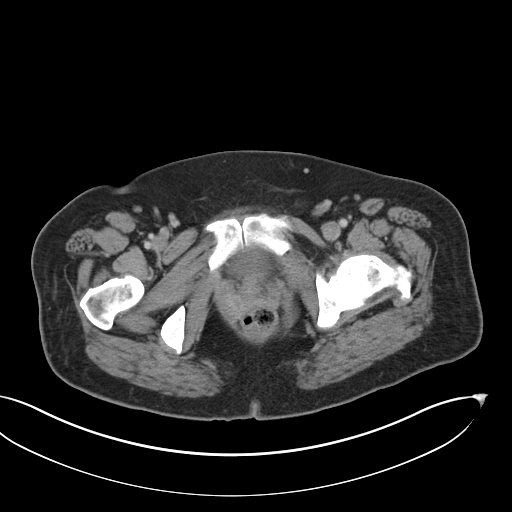
[im 19/85  soft-tissue]
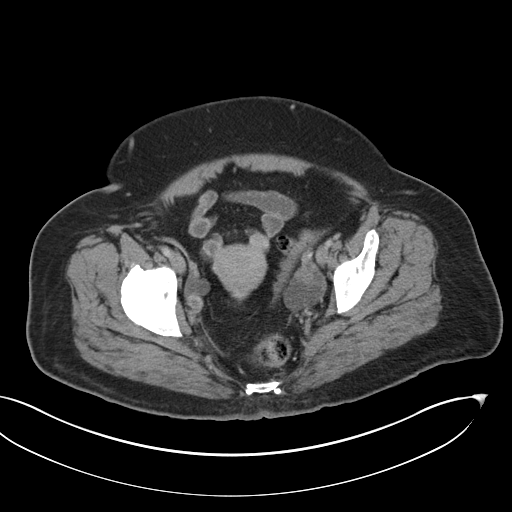
[im 24/85  soft-tissue]
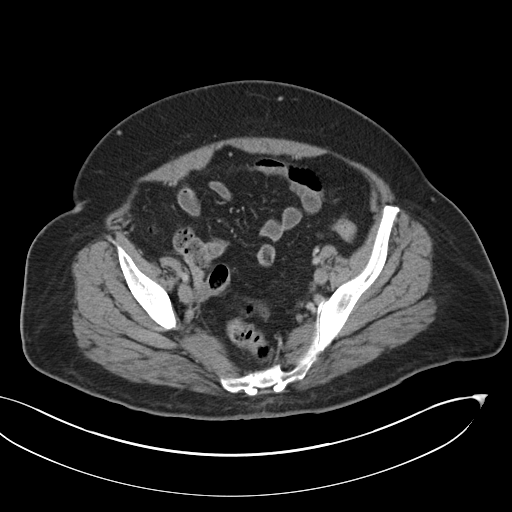
[im 29/85  soft-tissue]
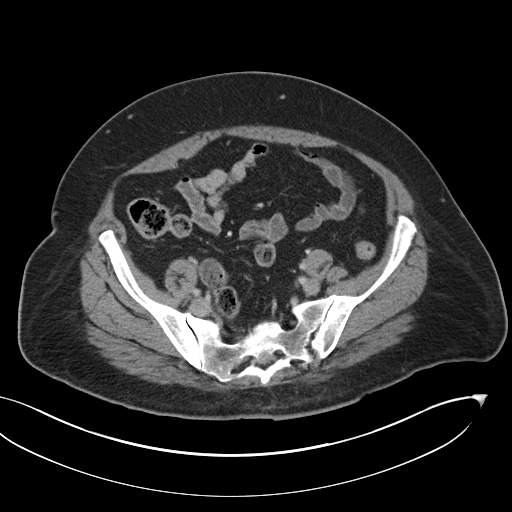
[im 38/85  soft-tissue]
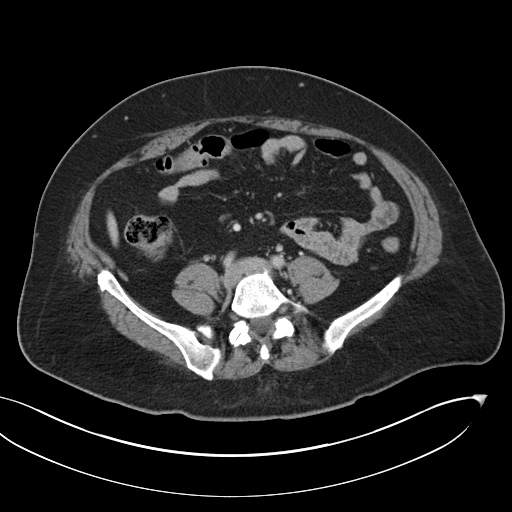
[im 43/85  soft-tissue]
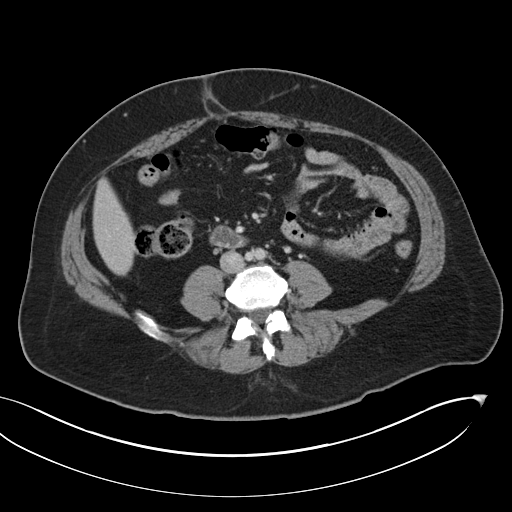
[im 47/85  soft-tissue]
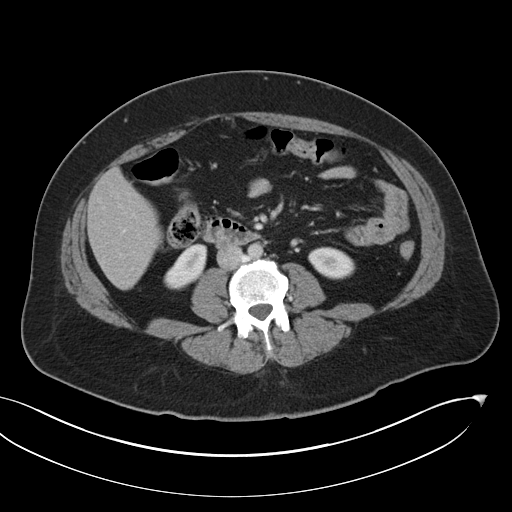
[im 57/85  soft-tissue]
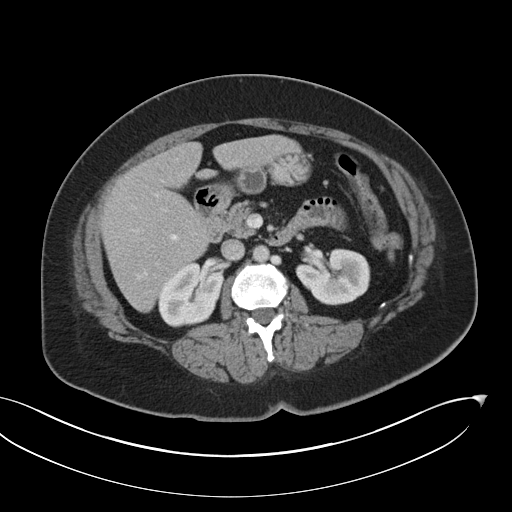
[im 57/85  bone]
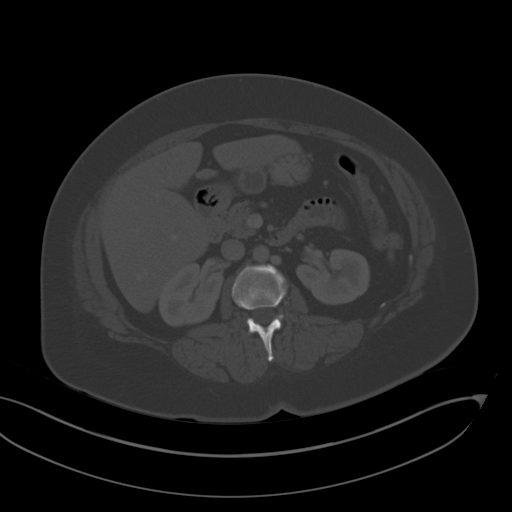
[im 61/85  soft-tissue]
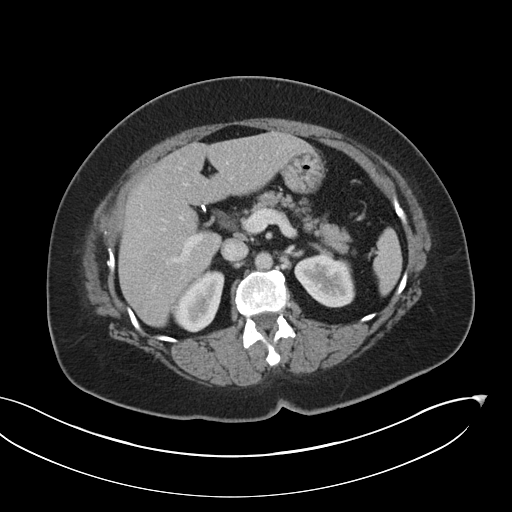
[im 66/85  soft-tissue]
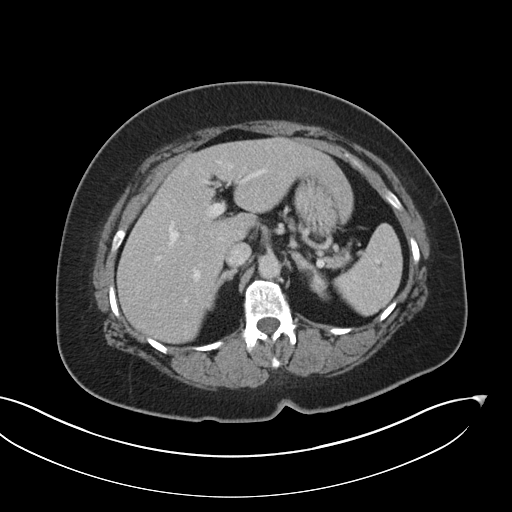
[im 66/85  lung]
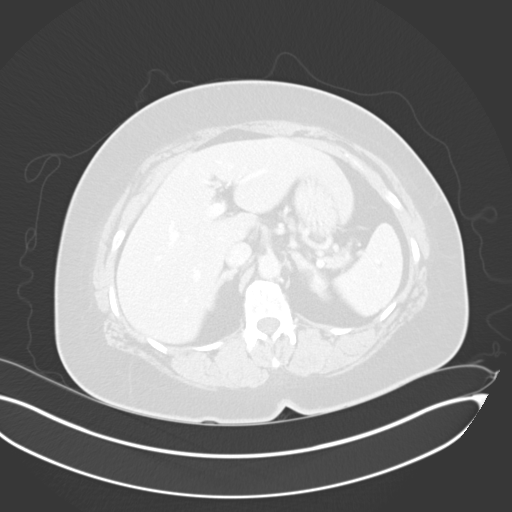
[im 71/85  lung]
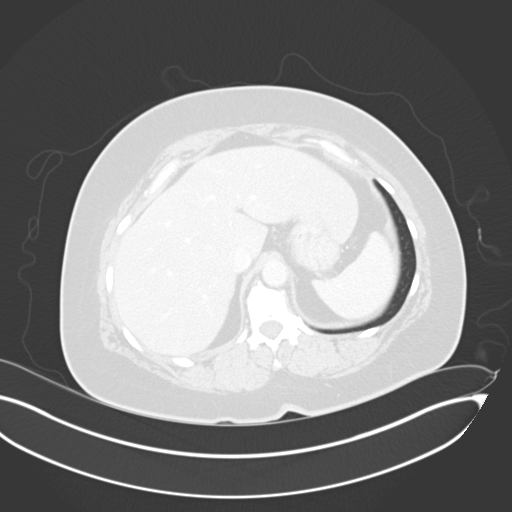
[im 75/85  soft-tissue]
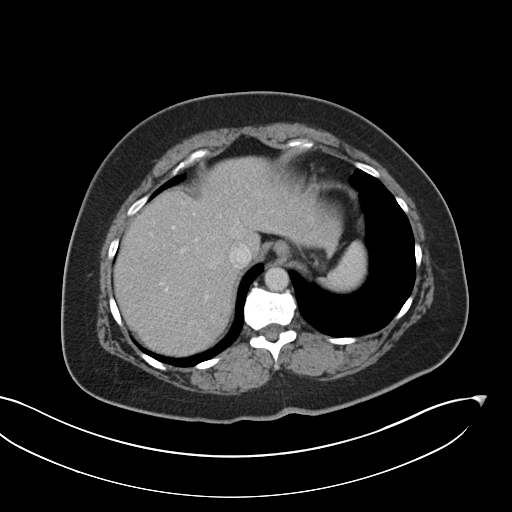
[im 75/85  lung]
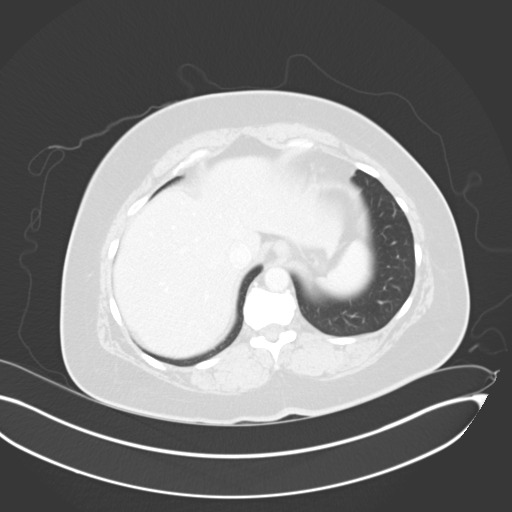
[im 80/85  soft-tissue]
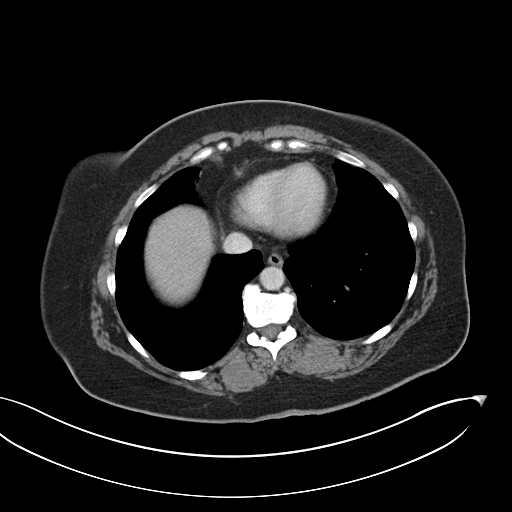
[im 80/85  lung]
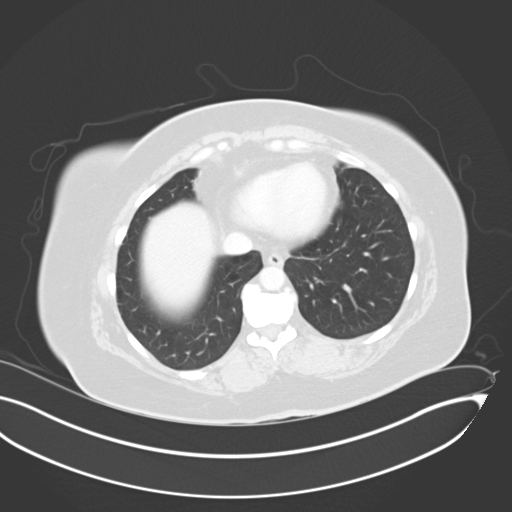

[14 of 32 positions shown; findings below may reference images not displayed]

FINDINGS: Liver normal. Spleen normal. Pancreas normal. Surgical clips
gallbladder fossa. No biliary distention.

Adrenals normal. Kidneys are unremarkable. No hydronephrosis or
obstructing ureteral stone. Bladder is nondistended. Uterus is
unremarkable. Bilateral small ovarian cysts are noted. Probable tiny
hemorrhagic cyst right ovary. No free pelvic fluid.

No adenopathy. Abdominal aorta normal. Visceral vessels patent.
Portal vein patent.

Appendix not visualized. No bowel distention. No inflammatory change
around left lower quadrant. Stomach is nondistended. No free air. No
mesenteric mass. No significant abdominal wall hernia. Tiny
umbilical hernia with herniation of fat only noted.

Heart size normal. Bases clear. Degenerative changes lumbar spine
and both hips. No acute bony abnormality.
IMPRESSION: No acute abnormality identified.
